# Patient Record
Sex: Male | Born: 1935 | Race: White | Hispanic: No | Marital: Married
Health system: Southern US, Community
[De-identification: ages and names within clinical notes are randomized; demographics above are authoritative.]

---

## 2006-10-31 ENCOUNTER — Ambulatory Visit: Payer: Self-pay | Admitting: Internal Medicine

## 2006-10-31 LAB — CONVERTED CEMR LAB
Basophils Relative: 0 % (ref 0.0–1.0)
Bilirubin, Direct: 0.1 mg/dL (ref 0.0–0.3)
CO2: 27 meq/L (ref 19–32)
Creatinine, Ser: 0.8 mg/dL (ref 0.4–1.5)
GFR calc Af Amer: 122 mL/min
HCT: 40.7 % (ref 39.0–52.0)
Hemoglobin: 14 g/dL (ref 13.0–17.0)
Lymphocytes Relative: 42.9 % (ref 12.0–46.0)
MCHC: 34.4 g/dL (ref 30.0–36.0)
Monocytes Absolute: 0.7 10*3/uL (ref 0.2–0.7)
Neutro Abs: 3.7 10*3/uL (ref 1.4–7.7)
Neutrophils Relative %: 45.3 % (ref 43.0–77.0)
Potassium: 4.5 meq/L (ref 3.5–5.1)
RDW: 13.5 % (ref 11.5–14.6)
Sodium: 138 meq/L (ref 135–145)
Total Bilirubin: 1 mg/dL (ref 0.3–1.2)
Total Protein: 6.8 g/dL (ref 6.0–8.3)

## 2006-11-29 ENCOUNTER — Ambulatory Visit: Payer: Self-pay | Admitting: Internal Medicine

## 2006-12-06 ENCOUNTER — Encounter: Payer: Self-pay | Admitting: Internal Medicine

## 2006-12-06 ENCOUNTER — Ambulatory Visit: Payer: Self-pay | Admitting: Internal Medicine

## 2006-12-06 DIAGNOSIS — K5730 Diverticulosis of large intestine without perforation or abscess without bleeding: Secondary | ICD-10-CM | POA: Insufficient documentation

## 2006-12-06 DIAGNOSIS — D126 Benign neoplasm of colon, unspecified: Secondary | ICD-10-CM

## 2010-08-17 NOTE — Assessment & Plan Note (Signed)
[facility]                         GASTROENTEROLOGY OFFICE NOTE   NAME:[name], Johnny [name]                          MRN:          [id number]  DATE:10/31/2006                            DOB:          Sep 25, 1931    CHIEF COMPLAINT:  History of fever, anemia, abnormal LFTs.   HISTORY:  [name] is a 77 year old white man that had a problem back  in June.  He had gone to [place] to visit family.  He had fevers,  headaches, and just felt terrible with a lot of fatigue.  All of those  symptoms except for fatigue have resolved.  While out there, he had some  blood work which showed a mild anemia and mild elevation of  transaminases.  He was told to get followup for this.  He returned home  to [place] and still had a little bit of trouble, and then went  to [place] to visit other family for a time, and since then has been  okay.  Records from [facility] in [place], [place], shows  notes from June 23 showing fatigue, malaise and temperature to 102.8.  There were no other symptoms noted other than the headache.  He did have  night sweats with this.  The laboratory evaluation is described.  His  hemoglobin was 12.7 and white count 10.1, platelets are slightly low at  116.  His differential showed a predominance of lymphocytes and  monocytes.  He also had some basophilia.  ALT 104, AST 44, ferritin high  at 1374 though his iron saturation was only 11%.  He had a sodium of  133, calcium 8.2, Osmolality 267, sed rate 7.  Ultrasound showed a  kidney cyst on the right 6.3 x 6.3 x 4.6 cm.  It was a large simple  cyst.  Common bile duct normal, gallbladder normal, liver normal.   GI REVIEW OF SYSTEMS:  Notable for some recent transient constipation,  but that is resolved.  Otherwise, GI review of systems is negative.   PAST MEDICAL HISTORY:  Entirely negative otherwise.   MEDICATIONS:  1. Vitamin 50 complete daily.  2. Vitamin E daily.  3. Vitamin C  daily.  4. Aspirin 81 mg daily.   DRUG ALLERGIES:  None known.   SOCIAL HISTORY:  He is married, here with his wife.  He chews tobacco,  uses some alcohol.  Retired from [employer].  One son, one  daughter.  Review of systems, see above.  Some low back pain.  He has a  hearing aid and poor hearing.  Still has a lot of fatigue, all other  systems negative.   PHYSICAL EXAMINATION:  Reveals a well-developed elderly white man in no  acute distress.  Height 5 feet 6 inches, weight 144.8 pounds, blood pressure 134/54,  pulse 72.  EYES:  Anicteric.  ENT:  Normal mouth, posterior pharynx.  NECK:  Supple, no thyromegaly or mass.  CHEST:  Clear.  HEART:  S1, S2, no murmurs, rubs, or gallops.  ABDOMEN:  Soft.  I detect no hepatosplenomegaly or mass.  LYMPHATIC:  No neck,  supraclavicular or axillary or inguinal adenopathy.  LOWER EXTREMITIES:  Free of edema.  SKIN:  Tanned.  There are some tattoos.  NEURO/PSYCH:  He is alert and oriented x3.   Note:  Hepatitis C antibody and hepatitis B surface antigen were  negative.   ASSESSMENT:  I think this man had some sort of virus causing fever and  headache.  The laboratory abnormalities with the monocytosis and  leukocytosis speak to that as well.  He had some transient  thrombocytopenia (presumably transient), and mild anemia.  He seems to  be recovered.   PLAN:  1. Recheck CMET and CBC with diff.  If this is all normalized, no      further workup for that.  2. It would be appropriate to recommend a screening colonoscopy, and I      have done so to the patient, though I would defer that until we      sort through these other issues.     Iva Boop, MD,FACG  Electronically Signed    CEG/MedQ  DD: 10/31/2006  DT: 11/01/2006  Job #: [id number]   cc:   [employer]

## 2011-01-20 ENCOUNTER — Encounter (INDEPENDENT_AMBULATORY_CARE_PROVIDER_SITE_OTHER): Payer: Medicare Other | Admitting: Ophthalmology

## 2011-01-20 DIAGNOSIS — H3530 Unspecified macular degeneration: Secondary | ICD-10-CM

## 2011-01-20 DIAGNOSIS — H353290 Exudative age-related macular degeneration, unspecified eye, stage unspecified: Secondary | ICD-10-CM

## 2011-01-20 DIAGNOSIS — H43399 Other vitreous opacities, unspecified eye: Secondary | ICD-10-CM

## 2011-04-28 ENCOUNTER — Encounter (INDEPENDENT_AMBULATORY_CARE_PROVIDER_SITE_OTHER): Payer: Medicare Other | Admitting: Ophthalmology

## 2011-04-28 DIAGNOSIS — H43819 Vitreous degeneration, unspecified eye: Secondary | ICD-10-CM

## 2011-04-28 DIAGNOSIS — H3530 Unspecified macular degeneration: Secondary | ICD-10-CM

## 2011-04-28 DIAGNOSIS — H353290 Exudative age-related macular degeneration, unspecified eye, stage unspecified: Secondary | ICD-10-CM

## 2011-08-18 ENCOUNTER — Encounter (INDEPENDENT_AMBULATORY_CARE_PROVIDER_SITE_OTHER): Payer: Medicare Other | Admitting: Ophthalmology

## 2011-08-18 DIAGNOSIS — H353290 Exudative age-related macular degeneration, unspecified eye, stage unspecified: Secondary | ICD-10-CM

## 2011-08-18 DIAGNOSIS — H3530 Unspecified macular degeneration: Secondary | ICD-10-CM

## 2011-08-18 DIAGNOSIS — H2510 Age-related nuclear cataract, unspecified eye: Secondary | ICD-10-CM

## 2011-08-18 DIAGNOSIS — H43819 Vitreous degeneration, unspecified eye: Secondary | ICD-10-CM

## 2011-12-22 ENCOUNTER — Encounter (INDEPENDENT_AMBULATORY_CARE_PROVIDER_SITE_OTHER): Payer: Medicare Other | Admitting: Ophthalmology

## 2011-12-22 DIAGNOSIS — H353290 Exudative age-related macular degeneration, unspecified eye, stage unspecified: Secondary | ICD-10-CM

## 2011-12-22 DIAGNOSIS — H3530 Unspecified macular degeneration: Secondary | ICD-10-CM

## 2011-12-22 DIAGNOSIS — H35379 Puckering of macula, unspecified eye: Secondary | ICD-10-CM

## 2011-12-22 DIAGNOSIS — H43819 Vitreous degeneration, unspecified eye: Secondary | ICD-10-CM

## 2011-12-22 DIAGNOSIS — H2510 Age-related nuclear cataract, unspecified eye: Secondary | ICD-10-CM

## 2012-04-26 ENCOUNTER — Encounter (INDEPENDENT_AMBULATORY_CARE_PROVIDER_SITE_OTHER): Payer: Medicare Other | Admitting: Ophthalmology

## 2012-04-26 DIAGNOSIS — H35349 Macular cyst, hole, or pseudohole, unspecified eye: Secondary | ICD-10-CM

## 2012-04-26 DIAGNOSIS — H3530 Unspecified macular degeneration: Secondary | ICD-10-CM

## 2012-04-26 DIAGNOSIS — H43819 Vitreous degeneration, unspecified eye: Secondary | ICD-10-CM

## 2012-04-26 DIAGNOSIS — H353290 Exudative age-related macular degeneration, unspecified eye, stage unspecified: Secondary | ICD-10-CM

## 2012-04-26 DIAGNOSIS — H35379 Puckering of macula, unspecified eye: Secondary | ICD-10-CM

## 2012-08-16 ENCOUNTER — Encounter (INDEPENDENT_AMBULATORY_CARE_PROVIDER_SITE_OTHER): Payer: Medicare Other | Admitting: Ophthalmology

## 2012-08-16 DIAGNOSIS — H353290 Exudative age-related macular degeneration, unspecified eye, stage unspecified: Secondary | ICD-10-CM

## 2012-08-16 DIAGNOSIS — H35349 Macular cyst, hole, or pseudohole, unspecified eye: Secondary | ICD-10-CM

## 2012-08-16 DIAGNOSIS — H3530 Unspecified macular degeneration: Secondary | ICD-10-CM

## 2012-08-16 DIAGNOSIS — H43819 Vitreous degeneration, unspecified eye: Secondary | ICD-10-CM

## 2013-01-10 ENCOUNTER — Encounter (INDEPENDENT_AMBULATORY_CARE_PROVIDER_SITE_OTHER): Payer: Medicare Other | Admitting: Ophthalmology

## 2013-01-10 DIAGNOSIS — H43819 Vitreous degeneration, unspecified eye: Secondary | ICD-10-CM

## 2013-01-10 DIAGNOSIS — H3530 Unspecified macular degeneration: Secondary | ICD-10-CM

## 2013-01-10 DIAGNOSIS — H353290 Exudative age-related macular degeneration, unspecified eye, stage unspecified: Secondary | ICD-10-CM

## 2013-07-18 ENCOUNTER — Encounter (INDEPENDENT_AMBULATORY_CARE_PROVIDER_SITE_OTHER): Payer: Medicare Other | Admitting: Ophthalmology

## 2013-07-18 DIAGNOSIS — H26499 Other secondary cataract, unspecified eye: Secondary | ICD-10-CM

## 2013-07-18 DIAGNOSIS — H43819 Vitreous degeneration, unspecified eye: Secondary | ICD-10-CM

## 2013-07-18 DIAGNOSIS — H3530 Unspecified macular degeneration: Secondary | ICD-10-CM

## 2013-07-18 DIAGNOSIS — H35379 Puckering of macula, unspecified eye: Secondary | ICD-10-CM

## 2013-07-31 ENCOUNTER — Other Ambulatory Visit (INDEPENDENT_AMBULATORY_CARE_PROVIDER_SITE_OTHER): Payer: Medicare Other | Admitting: Ophthalmology

## 2013-07-31 DIAGNOSIS — H26499 Other secondary cataract, unspecified eye: Secondary | ICD-10-CM

## 2013-08-28 ENCOUNTER — Ambulatory Visit (INDEPENDENT_AMBULATORY_CARE_PROVIDER_SITE_OTHER): Payer: Medicare Other | Admitting: Ophthalmology

## 2013-08-28 DIAGNOSIS — H2700 Aphakia, unspecified eye: Secondary | ICD-10-CM

## 2014-06-02 ENCOUNTER — Ambulatory Visit (INDEPENDENT_AMBULATORY_CARE_PROVIDER_SITE_OTHER): Payer: Medicare Other | Admitting: Ophthalmology

## 2014-06-02 DIAGNOSIS — H35372 Puckering of macula, left eye: Secondary | ICD-10-CM | POA: Diagnosis not present

## 2014-06-02 DIAGNOSIS — H3531 Nonexudative age-related macular degeneration: Secondary | ICD-10-CM

## 2014-06-02 DIAGNOSIS — H43813 Vitreous degeneration, bilateral: Secondary | ICD-10-CM | POA: Diagnosis not present

## 2015-03-04 ENCOUNTER — Ambulatory Visit (INDEPENDENT_AMBULATORY_CARE_PROVIDER_SITE_OTHER): Payer: Medicare Other | Admitting: Ophthalmology

## 2015-03-04 DIAGNOSIS — H43813 Vitreous degeneration, bilateral: Secondary | ICD-10-CM

## 2015-03-04 DIAGNOSIS — H353122 Nonexudative age-related macular degeneration, left eye, intermediate dry stage: Secondary | ICD-10-CM

## 2015-03-04 DIAGNOSIS — H353211 Exudative age-related macular degeneration, right eye, with active choroidal neovascularization: Secondary | ICD-10-CM

## 2015-03-04 DIAGNOSIS — H35372 Puckering of macula, left eye: Secondary | ICD-10-CM | POA: Diagnosis not present

## 2015-12-09 ENCOUNTER — Ambulatory Visit (INDEPENDENT_AMBULATORY_CARE_PROVIDER_SITE_OTHER): Payer: Medicare Other | Admitting: Ophthalmology

## 2015-12-09 DIAGNOSIS — H43813 Vitreous degeneration, bilateral: Secondary | ICD-10-CM

## 2015-12-09 DIAGNOSIS — H353122 Nonexudative age-related macular degeneration, left eye, intermediate dry stage: Secondary | ICD-10-CM

## 2015-12-09 DIAGNOSIS — H35372 Puckering of macula, left eye: Secondary | ICD-10-CM

## 2015-12-09 DIAGNOSIS — H353211 Exudative age-related macular degeneration, right eye, with active choroidal neovascularization: Secondary | ICD-10-CM

## 2016-09-07 ENCOUNTER — Ambulatory Visit (INDEPENDENT_AMBULATORY_CARE_PROVIDER_SITE_OTHER): Payer: Medicare Other | Admitting: Ophthalmology

## 2016-09-07 DIAGNOSIS — H353211 Exudative age-related macular degeneration, right eye, with active choroidal neovascularization: Secondary | ICD-10-CM

## 2016-09-07 DIAGNOSIS — H43813 Vitreous degeneration, bilateral: Secondary | ICD-10-CM

## 2016-09-07 DIAGNOSIS — H353121 Nonexudative age-related macular degeneration, left eye, early dry stage: Secondary | ICD-10-CM

## 2017-02-28 ENCOUNTER — Encounter: Payer: Self-pay | Admitting: Internal Medicine

## 2017-06-07 ENCOUNTER — Ambulatory Visit (INDEPENDENT_AMBULATORY_CARE_PROVIDER_SITE_OTHER): Payer: Medicare Other | Admitting: Ophthalmology

## 2017-06-07 DIAGNOSIS — H43813 Vitreous degeneration, bilateral: Secondary | ICD-10-CM

## 2017-06-07 DIAGNOSIS — H353211 Exudative age-related macular degeneration, right eye, with active choroidal neovascularization: Secondary | ICD-10-CM

## 2017-06-07 DIAGNOSIS — H353122 Nonexudative age-related macular degeneration, left eye, intermediate dry stage: Secondary | ICD-10-CM | POA: Diagnosis not present

## 2017-06-07 DIAGNOSIS — H35372 Puckering of macula, left eye: Secondary | ICD-10-CM

## 2018-03-09 ENCOUNTER — Encounter (INDEPENDENT_AMBULATORY_CARE_PROVIDER_SITE_OTHER): Payer: Medicare Other | Admitting: Ophthalmology

## 2018-03-09 DIAGNOSIS — H35373 Puckering of macula, bilateral: Secondary | ICD-10-CM

## 2018-03-09 DIAGNOSIS — H353122 Nonexudative age-related macular degeneration, left eye, intermediate dry stage: Secondary | ICD-10-CM

## 2018-03-09 DIAGNOSIS — H35342 Macular cyst, hole, or pseudohole, left eye: Secondary | ICD-10-CM

## 2018-03-09 DIAGNOSIS — H353211 Exudative age-related macular degeneration, right eye, with active choroidal neovascularization: Secondary | ICD-10-CM | POA: Diagnosis not present

## 2018-03-09 DIAGNOSIS — H43813 Vitreous degeneration, bilateral: Secondary | ICD-10-CM

## 2018-03-20 ENCOUNTER — Other Ambulatory Visit: Payer: Self-pay | Admitting: Physician Assistant

## 2018-03-20 ENCOUNTER — Ambulatory Visit
Admission: RE | Admit: 2018-03-20 | Discharge: 2018-03-20 | Disposition: A | Discharge status: Home / Self Care | Payer: Medicare Other | Source: Ambulatory Visit | Attending: Physician Assistant | Admitting: Physician Assistant

## 2018-03-20 DIAGNOSIS — T148XXA Other injury of unspecified body region, initial encounter: Secondary | ICD-10-CM

## 2018-12-11 ENCOUNTER — Encounter (INDEPENDENT_AMBULATORY_CARE_PROVIDER_SITE_OTHER): Payer: Medicare Other | Admitting: Ophthalmology

## 2018-12-19 ENCOUNTER — Encounter (INDEPENDENT_AMBULATORY_CARE_PROVIDER_SITE_OTHER): Payer: Medicare Other | Admitting: Ophthalmology

## 2018-12-19 ENCOUNTER — Other Ambulatory Visit: Payer: Self-pay

## 2018-12-19 DIAGNOSIS — H353122 Nonexudative age-related macular degeneration, left eye, intermediate dry stage: Secondary | ICD-10-CM

## 2018-12-19 DIAGNOSIS — H35372 Puckering of macula, left eye: Secondary | ICD-10-CM

## 2018-12-19 DIAGNOSIS — H353211 Exudative age-related macular degeneration, right eye, with active choroidal neovascularization: Secondary | ICD-10-CM

## 2018-12-19 DIAGNOSIS — H43813 Vitreous degeneration, bilateral: Secondary | ICD-10-CM | POA: Diagnosis not present

## 2019-05-19 ENCOUNTER — Ambulatory Visit: Payer: Medicare Other | Attending: Internal Medicine

## 2019-05-19 DIAGNOSIS — Z23 Encounter for immunization: Secondary | ICD-10-CM

## 2019-05-19 NOTE — Progress Notes (Signed)
   Covid-19 Vaccination Clinic  Name:  Samuel Pischel    MRN: [id number] DOB: Jul 25, 1931  05/19/2019  Mr. Flam was observed post Covid-19 immunization for 15 minutes without incidence. He was provided with Vaccine Information Sheet and instruction to access the V-Safe system.   Mr. Polyakov was instructed to call 911 with any severe reactions post vaccine: Marland Kitchen Difficulty breathing  . Swelling of your face and throat  . A fast heartbeat  . A bad rash all over your body  . Dizziness and weakness    Immunizations Administered    Name Date Dose VIS Date Route   Pfizer COVID-19 Vaccine 05/19/2019  8:46 AM 0.3 mL 03/15/2019 Intramuscular   Manufacturer: [place]   Lot: [id number]   [place]: [id number]

## 2019-06-11 ENCOUNTER — Ambulatory Visit: Payer: Medicare Other | Attending: Internal Medicine

## 2019-06-11 DIAGNOSIS — Z23 Encounter for immunization: Secondary | ICD-10-CM | POA: Insufficient documentation

## 2019-06-11 NOTE — Progress Notes (Signed)
   Covid-19 Vaccination Clinic  Name:  Johnny [name]    MRN: [id number] DOB: [birth year]  06/11/2019  [name] was observed post Covid-19 immunization for 15 minutes without incident. He was provided with Vaccine Information Sheet and instruction to access the V-Safe system.   [name] was instructed to call 911 with any severe reactions post vaccine: Marland Kitchen Difficulty breathing  . Swelling of face and throat  . A fast heartbeat  . A bad rash all over body  . Dizziness and weakness   Immunizations Administered    Name Date Dose VIS Date Route   Pfizer COVID-19 Vaccine 06/11/2019 11:55 AM 0.3 mL 03/15/2019 Intramuscular   Manufacturer: [place]   Lot: [id number]   [place]: [id number]

## 2019-09-19 ENCOUNTER — Other Ambulatory Visit: Payer: Self-pay

## 2019-09-19 ENCOUNTER — Encounter (INDEPENDENT_AMBULATORY_CARE_PROVIDER_SITE_OTHER): Payer: Medicare Other | Admitting: Ophthalmology

## 2019-09-19 DIAGNOSIS — H353122 Nonexudative age-related macular degeneration, left eye, intermediate dry stage: Secondary | ICD-10-CM | POA: Diagnosis not present

## 2019-09-19 DIAGNOSIS — H353211 Exudative age-related macular degeneration, right eye, with active choroidal neovascularization: Secondary | ICD-10-CM

## 2019-09-19 DIAGNOSIS — H43813 Vitreous degeneration, bilateral: Secondary | ICD-10-CM

## 2019-12-14 IMAGING — CR DG RIBS W/ CHEST 3+V*L*
3 series · 3 of 3 positions shown · non-contrast
Comparison: None

CLINICAL DATA: Fall 1 week ago.  Rib fractures.

EXAM:
LEFT RIBS AND CHEST - 3+ VIEW

[w chest pa]
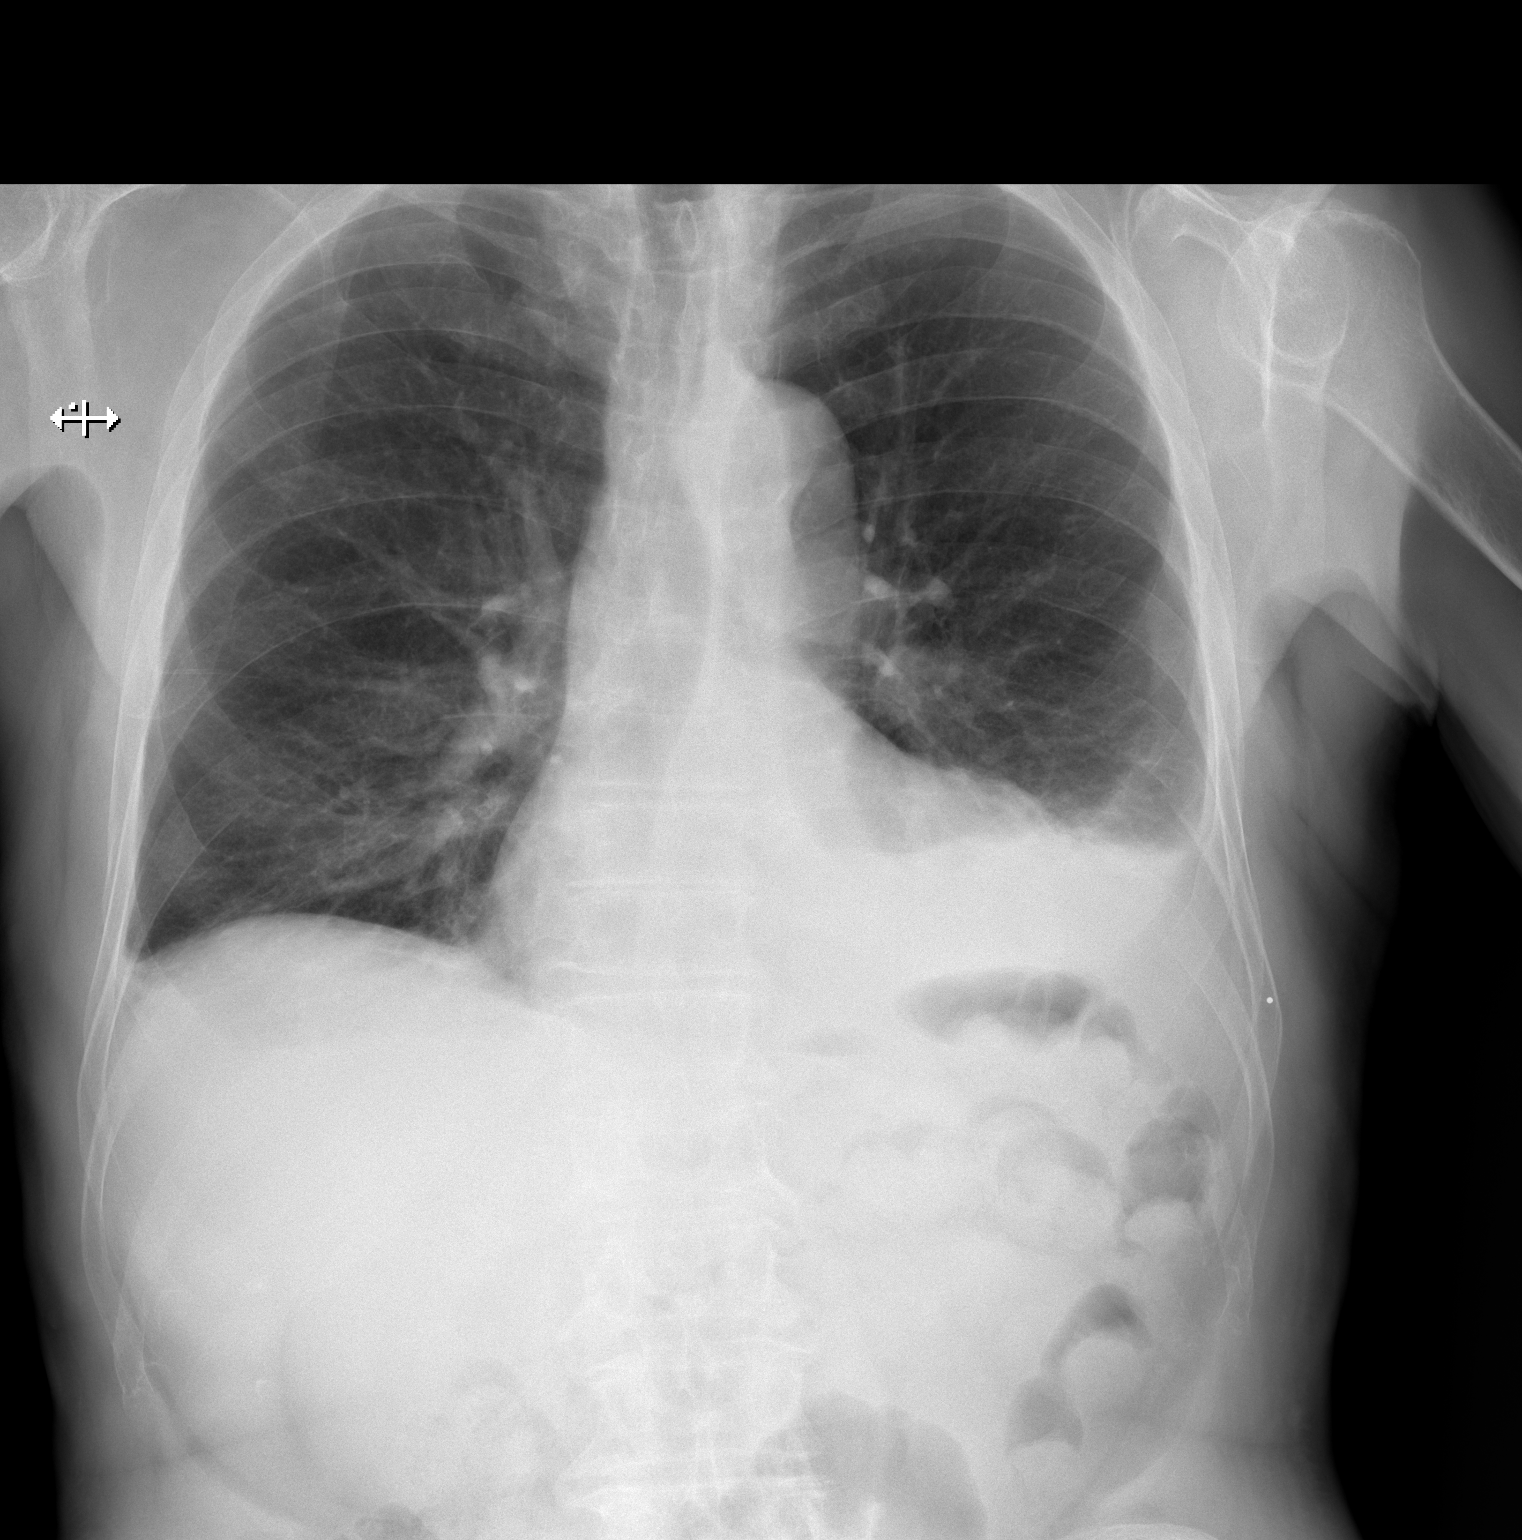

[w ribs ap upper left]
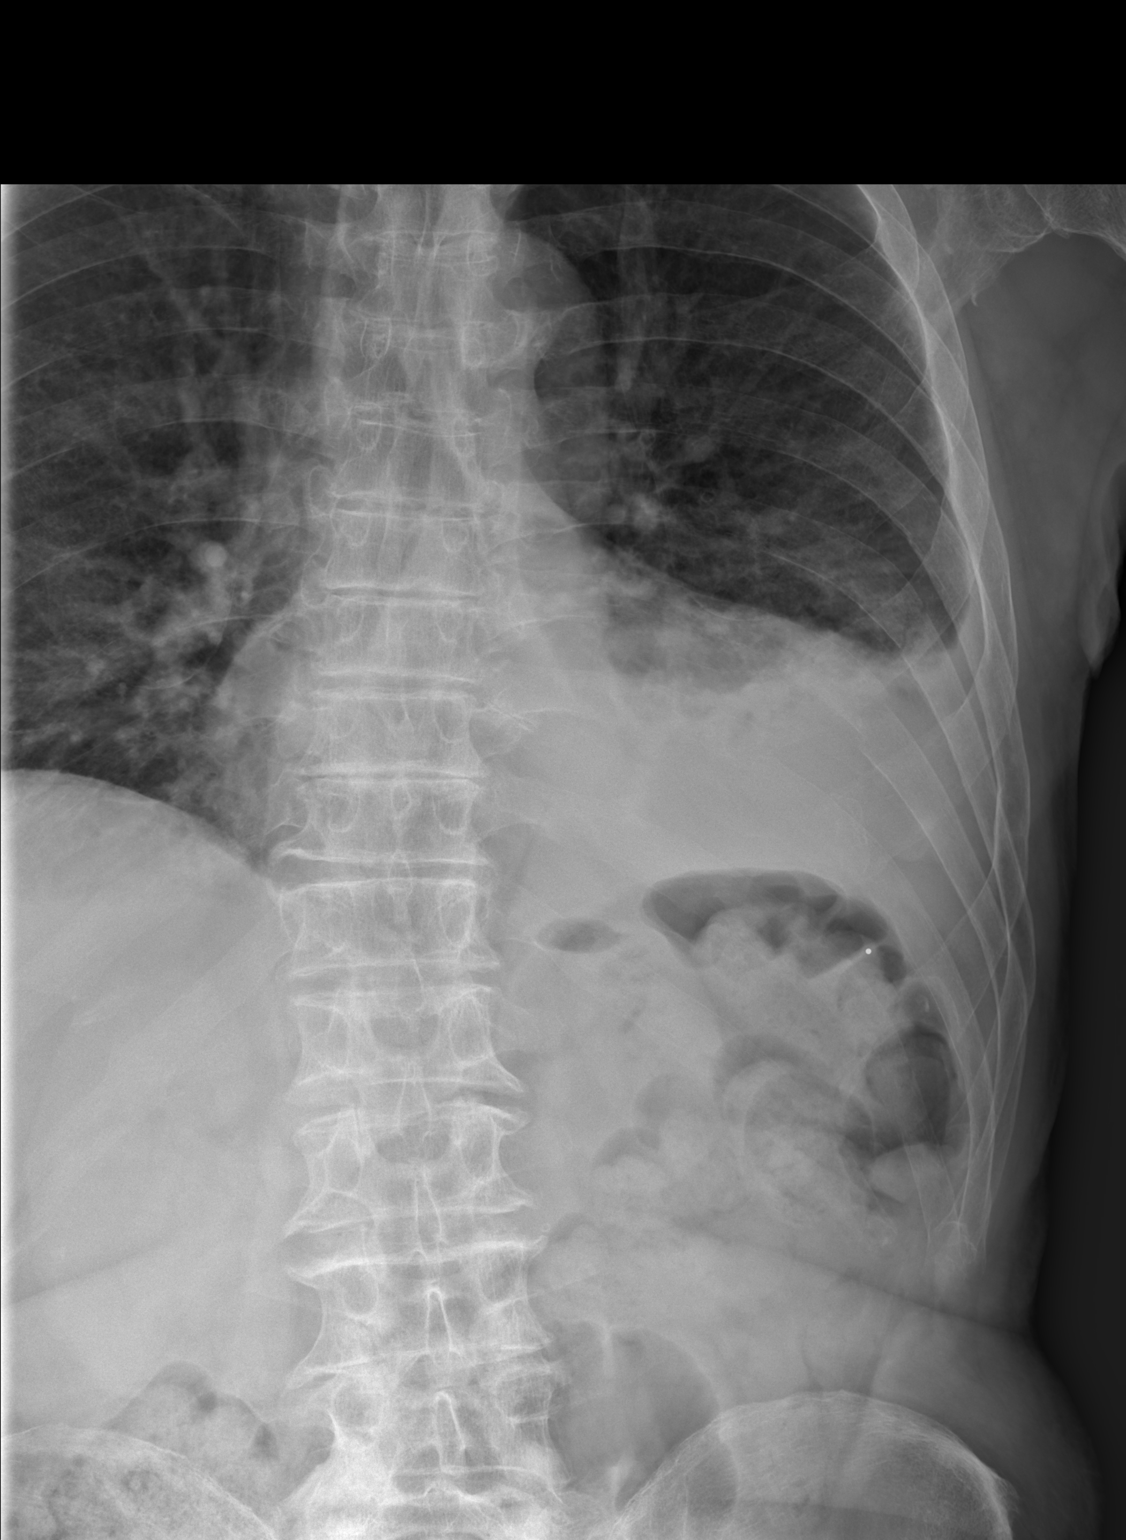

[w ribs ap lower left]
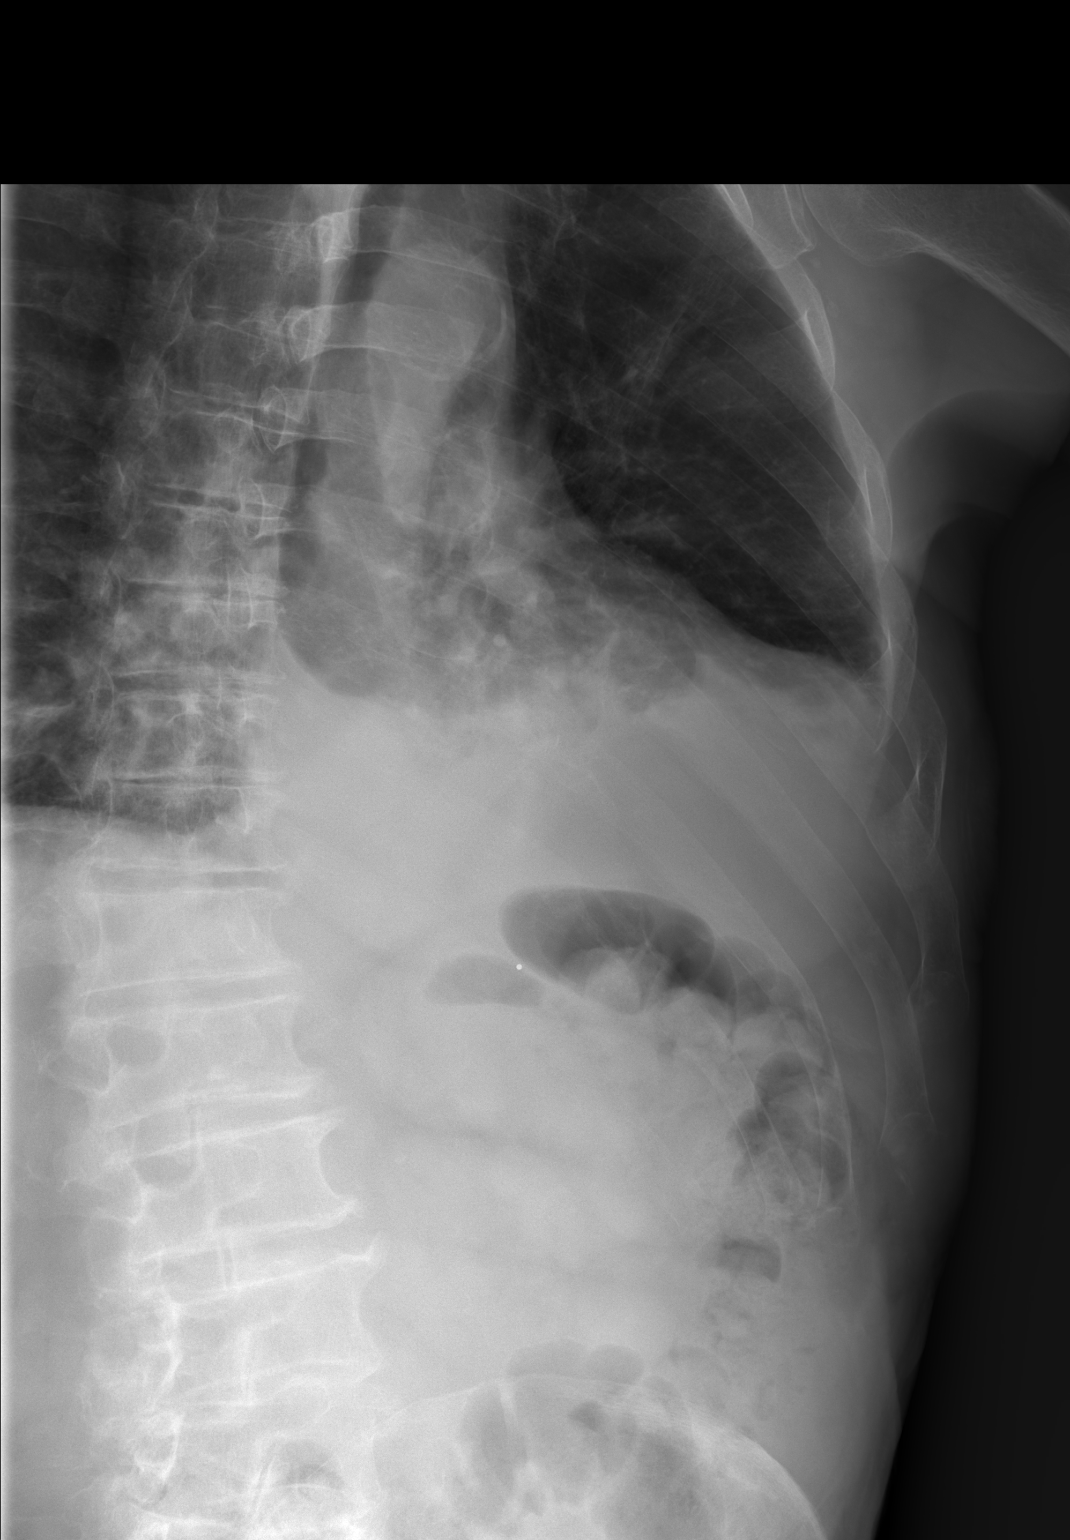

[3 of 3 positions shown; findings below may reference images not displayed]

FINDINGS: Mildly displaced fractures left eighth and ninth ribs
posterolaterally. Small to moderate left effusion with left lower
lobe atelectasis. No pneumothorax.

Right lung is clear.  Negative for heart failure.
IMPRESSION: Slightly displaced fractures left eighth and ninth ribs with left
pleural effusion consistent with hemothorax.

## 2020-06-18 ENCOUNTER — Encounter (INDEPENDENT_AMBULATORY_CARE_PROVIDER_SITE_OTHER): Payer: Medicare Other | Admitting: Ophthalmology

## 2020-06-18 ENCOUNTER — Other Ambulatory Visit: Payer: Self-pay

## 2020-06-18 DIAGNOSIS — H353122 Nonexudative age-related macular degeneration, left eye, intermediate dry stage: Secondary | ICD-10-CM | POA: Diagnosis not present

## 2020-06-18 DIAGNOSIS — H353211 Exudative age-related macular degeneration, right eye, with active choroidal neovascularization: Secondary | ICD-10-CM | POA: Diagnosis not present

## 2020-06-18 DIAGNOSIS — H43813 Vitreous degeneration, bilateral: Secondary | ICD-10-CM

## 2021-03-22 ENCOUNTER — Encounter (INDEPENDENT_AMBULATORY_CARE_PROVIDER_SITE_OTHER): Payer: Medicare HMO | Admitting: Ophthalmology

## 2021-03-22 ENCOUNTER — Other Ambulatory Visit: Payer: Self-pay

## 2021-03-22 DIAGNOSIS — H43813 Vitreous degeneration, bilateral: Secondary | ICD-10-CM

## 2021-03-22 DIAGNOSIS — H353122 Nonexudative age-related macular degeneration, left eye, intermediate dry stage: Secondary | ICD-10-CM

## 2021-03-22 DIAGNOSIS — H353211 Exudative age-related macular degeneration, right eye, with active choroidal neovascularization: Secondary | ICD-10-CM

## 2021-12-20 ENCOUNTER — Encounter (INDEPENDENT_AMBULATORY_CARE_PROVIDER_SITE_OTHER): Payer: Medicare HMO | Admitting: Ophthalmology

## 2021-12-20 DIAGNOSIS — H353211 Exudative age-related macular degeneration, right eye, with active choroidal neovascularization: Secondary | ICD-10-CM | POA: Diagnosis not present

## 2021-12-20 DIAGNOSIS — H353122 Nonexudative age-related macular degeneration, left eye, intermediate dry stage: Secondary | ICD-10-CM | POA: Diagnosis not present

## 2021-12-20 DIAGNOSIS — H43813 Vitreous degeneration, bilateral: Secondary | ICD-10-CM

## 2022-09-20 ENCOUNTER — Encounter (INDEPENDENT_AMBULATORY_CARE_PROVIDER_SITE_OTHER): Payer: Medicare HMO | Admitting: Ophthalmology

## 2022-09-20 DIAGNOSIS — H353122 Nonexudative age-related macular degeneration, left eye, intermediate dry stage: Secondary | ICD-10-CM

## 2022-09-20 DIAGNOSIS — H353211 Exudative age-related macular degeneration, right eye, with active choroidal neovascularization: Secondary | ICD-10-CM

## 2022-09-20 DIAGNOSIS — H43813 Vitreous degeneration, bilateral: Secondary | ICD-10-CM | POA: Diagnosis not present

## 2023-06-20 ENCOUNTER — Encounter (INDEPENDENT_AMBULATORY_CARE_PROVIDER_SITE_OTHER): Payer: Medicare HMO | Admitting: Ophthalmology

## 2023-06-20 DIAGNOSIS — H353122 Nonexudative age-related macular degeneration, left eye, intermediate dry stage: Secondary | ICD-10-CM

## 2023-06-20 DIAGNOSIS — H43813 Vitreous degeneration, bilateral: Secondary | ICD-10-CM | POA: Diagnosis not present

## 2023-06-20 DIAGNOSIS — H353211 Exudative age-related macular degeneration, right eye, with active choroidal neovascularization: Secondary | ICD-10-CM | POA: Diagnosis not present

## 2024-06-18 ENCOUNTER — Encounter (INDEPENDENT_AMBULATORY_CARE_PROVIDER_SITE_OTHER): Admitting: Ophthalmology
